# Patient Record
Sex: Female | Born: 1960 | Race: Black or African American | Hispanic: No | Marital: Married
Health system: Southern US, Community
[De-identification: ages and names within clinical notes are randomized; demographics above are authoritative.]

---

## 1997-12-17 ENCOUNTER — Other Ambulatory Visit: Admission: RE | Admit: 1997-12-17 | Discharge: 1997-12-17 | Payer: Self-pay | Admitting: Obstetrics and Gynecology

## 1998-05-03 ENCOUNTER — Encounter: Admission: RE | Admit: 1998-05-03 | Discharge: 1998-08-01 | Payer: Self-pay | Admitting: Obstetrics and Gynecology

## 1998-07-19 ENCOUNTER — Inpatient Hospital Stay (HOSPITAL_COMMUNITY): Admission: AD | Admit: 1998-07-19 | Discharge: 1998-07-21 | Payer: Self-pay | Admitting: Obstetrics and Gynecology

## 1998-09-03 ENCOUNTER — Other Ambulatory Visit: Admission: RE | Admit: 1998-09-03 | Discharge: 1998-09-03 | Payer: Self-pay | Admitting: Obstetrics and Gynecology

## 2000-08-19 ENCOUNTER — Encounter: Payer: Self-pay | Admitting: Family Medicine

## 2000-08-19 ENCOUNTER — Encounter: Admission: RE | Admit: 2000-08-19 | Discharge: 2000-08-19 | Payer: Self-pay | Admitting: Family Medicine

## 2001-09-07 ENCOUNTER — Encounter: Payer: Self-pay | Admitting: Family Medicine

## 2001-09-07 ENCOUNTER — Encounter: Admission: RE | Admit: 2001-09-07 | Discharge: 2001-09-07 | Payer: Self-pay | Admitting: Family Medicine

## 2002-09-11 ENCOUNTER — Encounter: Admission: RE | Admit: 2002-09-11 | Discharge: 2002-09-11 | Payer: Self-pay | Admitting: Family Medicine

## 2002-09-11 ENCOUNTER — Encounter: Payer: Self-pay | Admitting: Family Medicine

## 2003-11-07 ENCOUNTER — Encounter: Admission: RE | Admit: 2003-11-07 | Discharge: 2003-11-07 | Payer: Self-pay | Admitting: Family Medicine

## 2003-11-28 ENCOUNTER — Other Ambulatory Visit: Admission: RE | Admit: 2003-11-28 | Discharge: 2003-11-28 | Payer: Self-pay | Admitting: Family Medicine

## 2004-11-12 ENCOUNTER — Encounter: Admission: RE | Admit: 2004-11-12 | Discharge: 2004-11-12 | Payer: Self-pay | Admitting: Family Medicine

## 2004-11-28 ENCOUNTER — Other Ambulatory Visit: Admission: RE | Admit: 2004-11-28 | Discharge: 2004-11-28 | Payer: Self-pay | Admitting: Family Medicine

## 2004-12-11 ENCOUNTER — Other Ambulatory Visit: Admission: RE | Admit: 2004-12-11 | Discharge: 2004-12-11 | Payer: Self-pay | Admitting: Obstetrics and Gynecology

## 2005-06-10 ENCOUNTER — Other Ambulatory Visit: Admission: RE | Admit: 2005-06-10 | Discharge: 2005-06-10 | Payer: Self-pay | Admitting: Obstetrics and Gynecology

## 2005-11-24 ENCOUNTER — Encounter: Admission: RE | Admit: 2005-11-24 | Discharge: 2005-11-24 | Payer: Self-pay | Admitting: Family Medicine

## 2006-06-24 ENCOUNTER — Other Ambulatory Visit: Admission: RE | Admit: 2006-06-24 | Discharge: 2006-06-24 | Payer: Self-pay | Admitting: Obstetrics and Gynecology

## 2006-09-20 ENCOUNTER — Encounter: Admission: RE | Admit: 2006-09-20 | Discharge: 2006-09-20 | Payer: Self-pay | Admitting: Family Medicine

## 2006-11-29 ENCOUNTER — Encounter: Admission: RE | Admit: 2006-11-29 | Discharge: 2006-11-29 | Payer: Self-pay | Admitting: Family Medicine

## 2007-06-27 ENCOUNTER — Other Ambulatory Visit: Admission: RE | Admit: 2007-06-27 | Discharge: 2007-06-27 | Payer: Self-pay | Admitting: Obstetrics and Gynecology

## 2007-11-30 ENCOUNTER — Encounter: Admission: RE | Admit: 2007-11-30 | Discharge: 2007-11-30 | Payer: Self-pay | Admitting: Family Medicine

## 2008-11-30 ENCOUNTER — Encounter: Admission: RE | Admit: 2008-11-30 | Discharge: 2008-11-30 | Payer: Self-pay | Admitting: Family Medicine

## 2009-06-14 ENCOUNTER — Other Ambulatory Visit: Admission: RE | Admit: 2009-06-14 | Discharge: 2009-06-14 | Payer: Self-pay | Admitting: Family Medicine

## 2009-09-11 ENCOUNTER — Encounter: Admission: RE | Admit: 2009-09-11 | Discharge: 2009-09-11 | Payer: Self-pay | Admitting: Family Medicine

## 2009-09-26 ENCOUNTER — Ambulatory Visit (HOSPITAL_BASED_OUTPATIENT_CLINIC_OR_DEPARTMENT_OTHER): Admission: RE | Admit: 2009-09-26 | Discharge: 2009-09-26 | Payer: Self-pay | Admitting: Orthopedic Surgery

## 2009-12-02 ENCOUNTER — Encounter: Admission: RE | Admit: 2009-12-02 | Discharge: 2009-12-02 | Payer: Self-pay | Admitting: Family Medicine

## 2009-12-16 ENCOUNTER — Encounter: Admission: RE | Admit: 2009-12-16 | Discharge: 2009-12-16 | Payer: Self-pay | Admitting: Family Medicine

## 2009-12-16 IMAGING — US US BREAST R
1 series · 4 of 4 positions shown · non-contrast
Comparison: [DATE], [DATE], [DATE]

CLINICAL DATA: The patient returns for evaluation of a possible
mass in the right breast noted on recent screening study dated
[DATE].

DIGITAL DIAGNOSTIC RIGHT LIMITED MAMMOGRAM  AND RIGHT BREAST
ULTRASOUND:

[Series 1: us breast right · 4 of 4 slices shown]
[im 1/4]
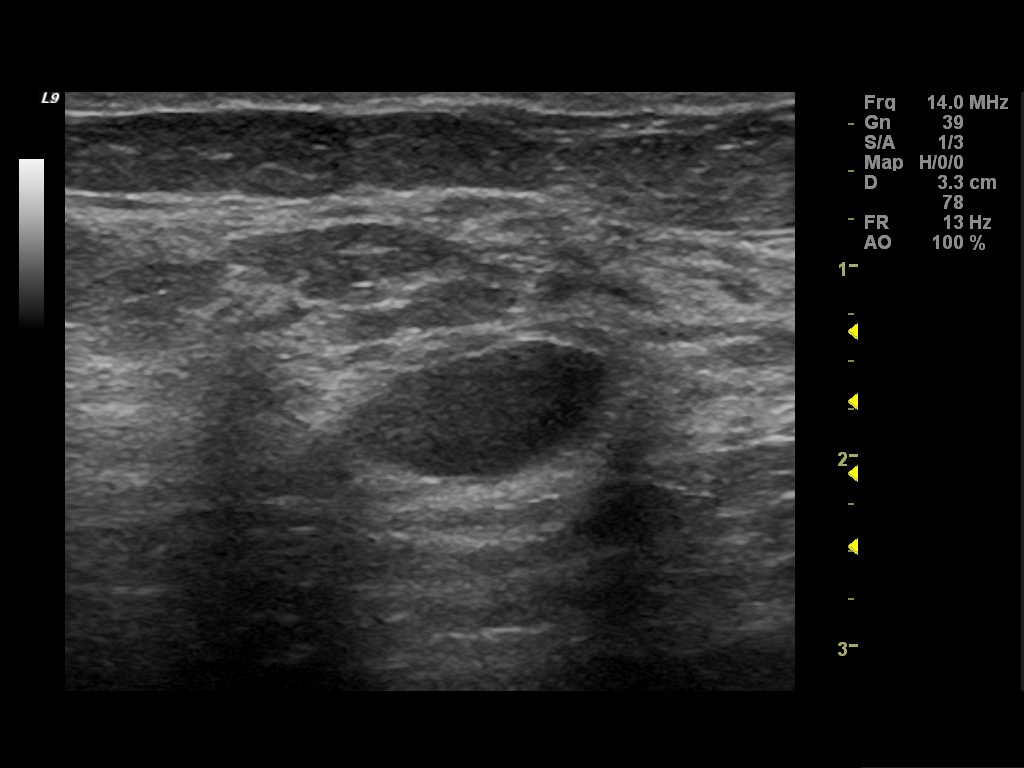
[im 2/4]
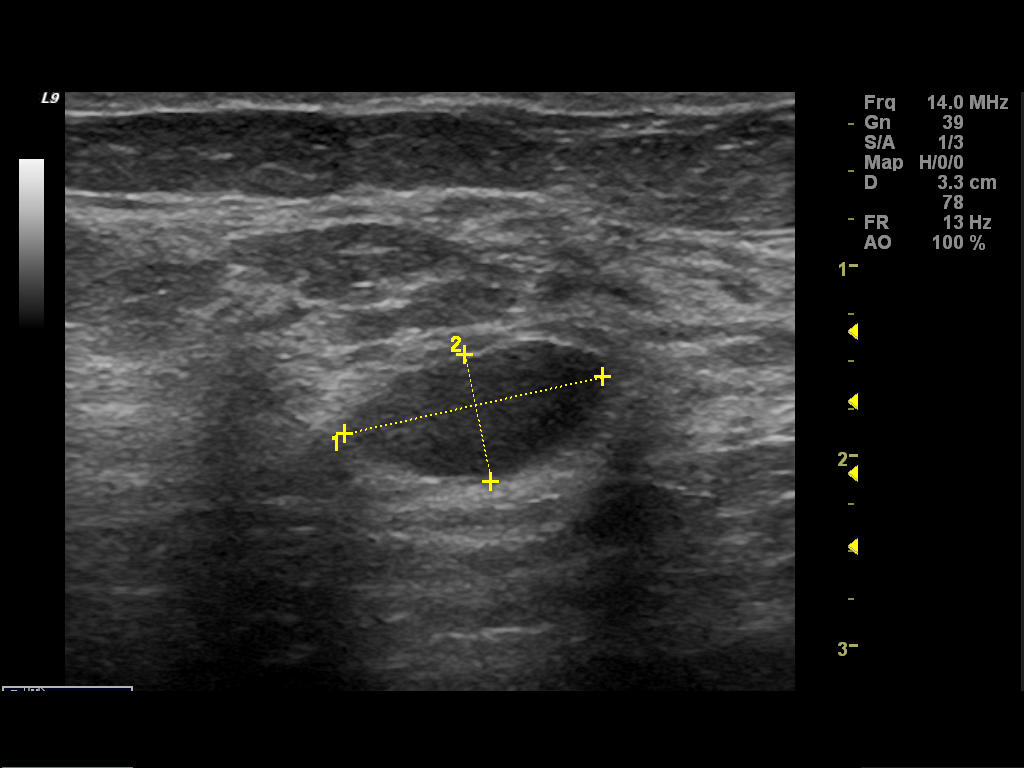
[im 3/4]
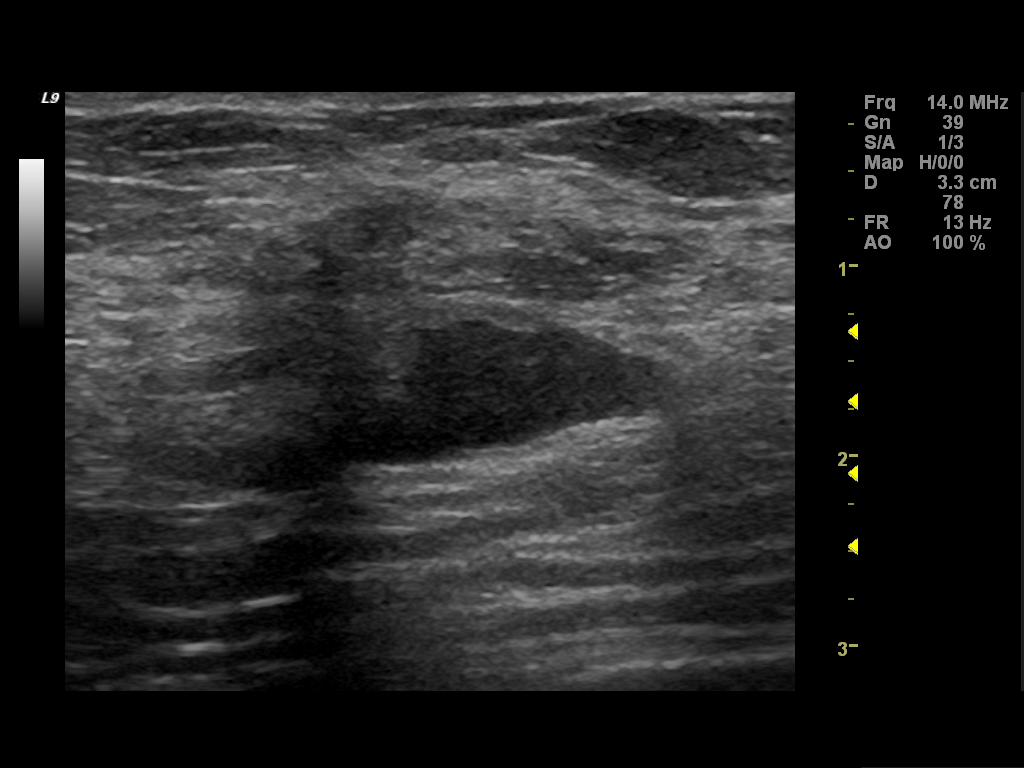
[im 4/4]
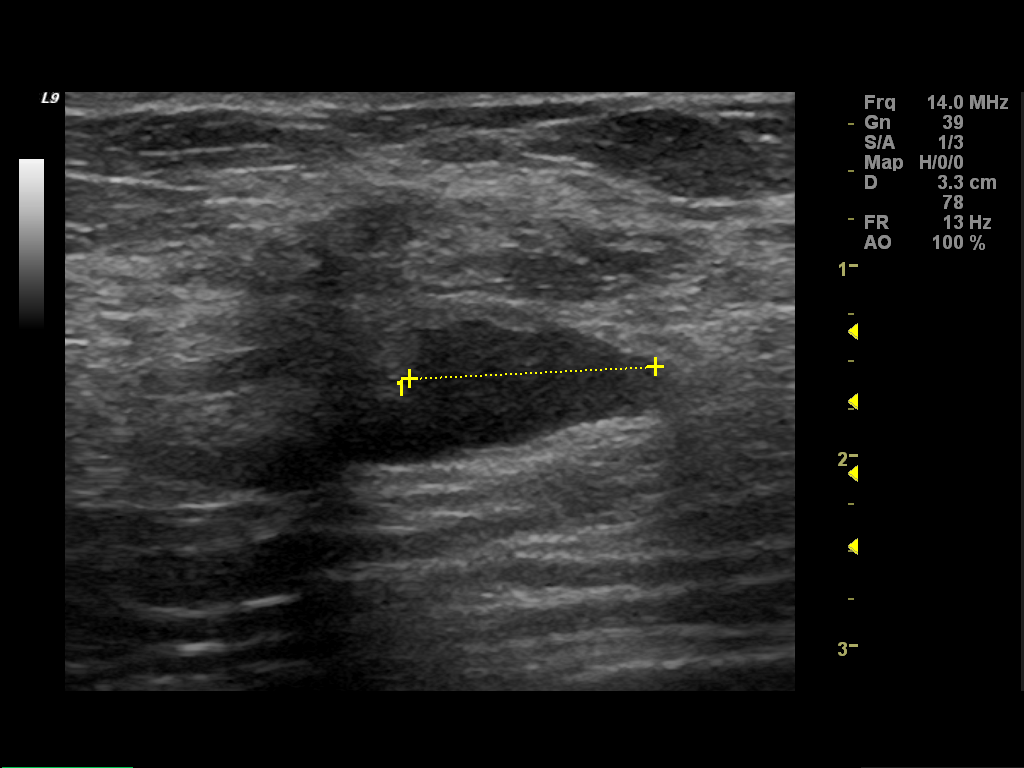

[4 of 4 positions shown; findings below may reference images not displayed]

FINDINGS: Spot compression views confirm the presence of a
partially obscured partially circumscribed oval mass in the mid
right upper outer quadrant.

On physical exam, no mass is palpated in the right upper outer
quadrant.

Ultrasound is performed, showing a cyst at 9 o'clock 7 cm from the
right nipple measuring 1.4 x 0.7 x 1.3 cm.  There is no solid mass,
distortion or shadowing to suggest malignancy.
IMPRESSION: The mammographic nodule corresponds to a cyst.  No mammographic or
sonographic evidence of malignancy.  Yearly screening mammography
is suggested.

BI-RADS CATEGORY 2:  Benign finding(s).

## 2010-07-30 ENCOUNTER — Other Ambulatory Visit (HOSPITAL_COMMUNITY)
Admission: RE | Admit: 2010-07-30 | Discharge: 2010-07-30 | Disposition: A | Discharge status: Home / Self Care | Payer: BC Managed Care – PPO | Source: Ambulatory Visit | Attending: Family Medicine | Admitting: Family Medicine

## 2010-07-30 ENCOUNTER — Other Ambulatory Visit: Payer: Self-pay | Admitting: Family Medicine

## 2010-07-30 DIAGNOSIS — Z01419 Encounter for gynecological examination (general) (routine) without abnormal findings: Secondary | ICD-10-CM | POA: Insufficient documentation

## 2011-01-19 ENCOUNTER — Other Ambulatory Visit: Payer: Self-pay | Admitting: Family Medicine

## 2011-01-19 DIAGNOSIS — Z1231 Encounter for screening mammogram for malignant neoplasm of breast: Secondary | ICD-10-CM

## 2011-02-05 ENCOUNTER — Ambulatory Visit
Admission: RE | Admit: 2011-02-05 | Discharge: 2011-02-05 | Disposition: A | Discharge status: Home / Self Care | Payer: BC Managed Care – PPO | Source: Ambulatory Visit | Attending: Family Medicine | Admitting: Family Medicine

## 2011-02-05 DIAGNOSIS — Z1231 Encounter for screening mammogram for malignant neoplasm of breast: Secondary | ICD-10-CM

## 2011-02-13 ENCOUNTER — Other Ambulatory Visit: Payer: Self-pay | Admitting: Family Medicine

## 2011-02-13 DIAGNOSIS — R928 Other abnormal and inconclusive findings on diagnostic imaging of breast: Secondary | ICD-10-CM

## 2011-02-19 ENCOUNTER — Ambulatory Visit
Admission: RE | Admit: 2011-02-19 | Discharge: 2011-02-19 | Disposition: A | Discharge status: Home / Self Care | Payer: BC Managed Care – PPO | Source: Ambulatory Visit | Attending: Family Medicine | Admitting: Family Medicine

## 2011-02-19 DIAGNOSIS — R928 Other abnormal and inconclusive findings on diagnostic imaging of breast: Secondary | ICD-10-CM

## 2011-09-30 ENCOUNTER — Other Ambulatory Visit (HOSPITAL_COMMUNITY)
Admission: RE | Admit: 2011-09-30 | Discharge: 2011-09-30 | Disposition: A | Discharge status: Home / Self Care | Payer: BC Managed Care – PPO | Source: Ambulatory Visit | Attending: Family Medicine | Admitting: Family Medicine

## 2011-09-30 ENCOUNTER — Other Ambulatory Visit: Payer: Self-pay | Admitting: Family Medicine

## 2011-09-30 DIAGNOSIS — Z124 Encounter for screening for malignant neoplasm of cervix: Secondary | ICD-10-CM | POA: Insufficient documentation

## 2013-05-17 ENCOUNTER — Other Ambulatory Visit: Payer: Self-pay

## 2013-05-17 DIAGNOSIS — Z1231 Encounter for screening mammogram for malignant neoplasm of breast: Secondary | ICD-10-CM

## 2013-05-30 ENCOUNTER — Encounter (INDEPENDENT_AMBULATORY_CARE_PROVIDER_SITE_OTHER): Payer: Self-pay

## 2013-05-30 ENCOUNTER — Ambulatory Visit
Admission: RE | Admit: 2013-05-30 | Discharge: 2013-05-30 | Disposition: A | Discharge status: Home / Self Care | Payer: BC Managed Care – PPO | Source: Ambulatory Visit

## 2013-05-30 DIAGNOSIS — Z1231 Encounter for screening mammogram for malignant neoplasm of breast: Secondary | ICD-10-CM

## 2013-06-01 ENCOUNTER — Other Ambulatory Visit: Payer: Self-pay | Admitting: Family Medicine

## 2013-06-01 DIAGNOSIS — R928 Other abnormal and inconclusive findings on diagnostic imaging of breast: Secondary | ICD-10-CM

## 2013-06-12 ENCOUNTER — Ambulatory Visit
Admission: RE | Admit: 2013-06-12 | Discharge: 2013-06-12 | Disposition: A | Discharge status: Home / Self Care | Payer: BC Managed Care – PPO | Source: Ambulatory Visit | Attending: Family Medicine | Admitting: Family Medicine

## 2013-06-12 DIAGNOSIS — R928 Other abnormal and inconclusive findings on diagnostic imaging of breast: Secondary | ICD-10-CM

## 2013-12-15 ENCOUNTER — Other Ambulatory Visit: Payer: BC Managed Care – PPO

## 2013-12-15 ENCOUNTER — Ambulatory Visit: Payer: BC Managed Care – PPO | Admitting: Hematology and Oncology

## 2014-05-17 ENCOUNTER — Other Ambulatory Visit: Payer: Self-pay

## 2014-05-17 DIAGNOSIS — Z1231 Encounter for screening mammogram for malignant neoplasm of breast: Secondary | ICD-10-CM

## 2014-06-14 ENCOUNTER — Ambulatory Visit
Admission: RE | Admit: 2014-06-14 | Discharge: 2014-06-14 | Disposition: A | Discharge status: Home / Self Care | Payer: BC Managed Care – PPO | Source: Ambulatory Visit

## 2014-06-14 DIAGNOSIS — Z1231 Encounter for screening mammogram for malignant neoplasm of breast: Secondary | ICD-10-CM

## 2014-11-09 ENCOUNTER — Other Ambulatory Visit: Payer: Self-pay | Admitting: Family Medicine

## 2014-11-09 ENCOUNTER — Other Ambulatory Visit (HOSPITAL_COMMUNITY)
Admission: RE | Admit: 2014-11-09 | Discharge: 2014-11-09 | Disposition: A | Discharge status: Home / Self Care | Payer: BC Managed Care – PPO | Source: Ambulatory Visit | Attending: Family Medicine | Admitting: Family Medicine

## 2014-11-09 DIAGNOSIS — Z124 Encounter for screening for malignant neoplasm of cervix: Secondary | ICD-10-CM | POA: Diagnosis not present

## 2014-11-13 LAB — CYTOLOGY - PAP

## 2015-05-16 ENCOUNTER — Other Ambulatory Visit: Payer: Self-pay

## 2015-05-16 DIAGNOSIS — Z1231 Encounter for screening mammogram for malignant neoplasm of breast: Secondary | ICD-10-CM

## 2015-06-19 ENCOUNTER — Ambulatory Visit: Payer: BC Managed Care – PPO

## 2015-07-04 ENCOUNTER — Ambulatory Visit
Admission: RE | Admit: 2015-07-04 | Discharge: 2015-07-04 | Disposition: A | Discharge status: Home / Self Care | Payer: BC Managed Care – PPO | Source: Ambulatory Visit

## 2015-07-04 DIAGNOSIS — Z1231 Encounter for screening mammogram for malignant neoplasm of breast: Secondary | ICD-10-CM

## 2016-05-27 ENCOUNTER — Other Ambulatory Visit: Payer: Self-pay | Admitting: Family Medicine

## 2016-05-27 DIAGNOSIS — Z1231 Encounter for screening mammogram for malignant neoplasm of breast: Secondary | ICD-10-CM

## 2016-07-10 ENCOUNTER — Ambulatory Visit
Admission: RE | Admit: 2016-07-10 | Discharge: 2016-07-10 | Disposition: A | Discharge status: Home / Self Care | Payer: BC Managed Care – PPO | Source: Ambulatory Visit | Attending: Family Medicine | Admitting: Family Medicine

## 2016-07-10 DIAGNOSIS — Z1231 Encounter for screening mammogram for malignant neoplasm of breast: Secondary | ICD-10-CM

## 2017-04-01 ENCOUNTER — Other Ambulatory Visit: Payer: Self-pay | Admitting: Radiology

## 2017-07-12 ENCOUNTER — Other Ambulatory Visit: Payer: Self-pay | Admitting: Family Medicine

## 2017-07-12 DIAGNOSIS — Z1231 Encounter for screening mammogram for malignant neoplasm of breast: Secondary | ICD-10-CM

## 2017-08-02 ENCOUNTER — Ambulatory Visit
Admission: RE | Admit: 2017-08-02 | Discharge: 2017-08-02 | Disposition: A | Discharge status: Home / Self Care | Payer: BC Managed Care – PPO | Source: Ambulatory Visit | Attending: Family Medicine | Admitting: Family Medicine

## 2017-08-02 DIAGNOSIS — Z1231 Encounter for screening mammogram for malignant neoplasm of breast: Secondary | ICD-10-CM

## 2018-01-18 ENCOUNTER — Other Ambulatory Visit: Payer: Self-pay | Admitting: Family Medicine

## 2018-01-18 ENCOUNTER — Other Ambulatory Visit (HOSPITAL_COMMUNITY)
Admission: RE | Admit: 2018-01-18 | Discharge: 2018-01-18 | Disposition: A | Discharge status: Home / Self Care | Payer: BC Managed Care – PPO | Source: Ambulatory Visit | Attending: Family Medicine | Admitting: Family Medicine

## 2018-01-18 DIAGNOSIS — Z01419 Encounter for gynecological examination (general) (routine) without abnormal findings: Secondary | ICD-10-CM | POA: Insufficient documentation

## 2018-01-20 LAB — CYTOLOGY - PAP
Adequacy: ABSENT
Diagnosis: NEGATIVE

## 2018-08-08 ENCOUNTER — Other Ambulatory Visit: Payer: Self-pay | Admitting: Family Medicine

## 2018-08-08 DIAGNOSIS — Z1231 Encounter for screening mammogram for malignant neoplasm of breast: Secondary | ICD-10-CM

## 2018-09-20 ENCOUNTER — Other Ambulatory Visit: Payer: Self-pay

## 2018-09-20 ENCOUNTER — Ambulatory Visit
Admission: RE | Admit: 2018-09-20 | Discharge: 2018-09-20 | Disposition: A | Discharge status: Home / Self Care | Payer: BC Managed Care – PPO | Source: Ambulatory Visit | Attending: Family Medicine | Admitting: Family Medicine

## 2018-09-20 DIAGNOSIS — Z1231 Encounter for screening mammogram for malignant neoplasm of breast: Secondary | ICD-10-CM

## 2019-09-05 ENCOUNTER — Other Ambulatory Visit: Payer: Self-pay | Admitting: Family Medicine

## 2019-09-05 DIAGNOSIS — Z1231 Encounter for screening mammogram for malignant neoplasm of breast: Secondary | ICD-10-CM

## 2019-09-21 ENCOUNTER — Ambulatory Visit
Admission: RE | Admit: 2019-09-21 | Discharge: 2019-09-21 | Disposition: A | Discharge status: Home / Self Care | Payer: BC Managed Care – PPO | Source: Ambulatory Visit | Attending: Family Medicine | Admitting: Family Medicine

## 2019-09-21 ENCOUNTER — Other Ambulatory Visit: Payer: Self-pay

## 2019-09-21 DIAGNOSIS — Z1231 Encounter for screening mammogram for malignant neoplasm of breast: Secondary | ICD-10-CM

## 2020-08-19 ENCOUNTER — Other Ambulatory Visit: Payer: Self-pay | Admitting: Family Medicine

## 2020-08-19 DIAGNOSIS — Z1231 Encounter for screening mammogram for malignant neoplasm of breast: Secondary | ICD-10-CM

## 2020-10-11 ENCOUNTER — Other Ambulatory Visit: Payer: Self-pay

## 2020-10-11 ENCOUNTER — Ambulatory Visit
Admission: RE | Admit: 2020-10-11 | Discharge: 2020-10-11 | Disposition: A | Discharge status: Home / Self Care | Payer: BC Managed Care – PPO | Source: Ambulatory Visit | Attending: Family Medicine | Admitting: Family Medicine

## 2020-10-11 DIAGNOSIS — Z1231 Encounter for screening mammogram for malignant neoplasm of breast: Secondary | ICD-10-CM

## 2021-09-15 ENCOUNTER — Other Ambulatory Visit: Payer: Self-pay | Admitting: Family Medicine

## 2021-09-15 DIAGNOSIS — Z1231 Encounter for screening mammogram for malignant neoplasm of breast: Secondary | ICD-10-CM

## 2021-10-14 ENCOUNTER — Ambulatory Visit
Admission: RE | Admit: 2021-10-14 | Discharge: 2021-10-14 | Disposition: A | Discharge status: Home / Self Care | Payer: BC Managed Care – PPO | Source: Ambulatory Visit | Attending: Family Medicine | Admitting: Family Medicine

## 2021-10-14 DIAGNOSIS — Z1231 Encounter for screening mammogram for malignant neoplasm of breast: Secondary | ICD-10-CM

## 2021-10-15 ENCOUNTER — Other Ambulatory Visit: Payer: Self-pay | Admitting: Family Medicine

## 2021-10-15 DIAGNOSIS — R928 Other abnormal and inconclusive findings on diagnostic imaging of breast: Secondary | ICD-10-CM

## 2021-10-24 ENCOUNTER — Ambulatory Visit
Admission: RE | Admit: 2021-10-24 | Discharge: 2021-10-24 | Disposition: A | Discharge status: Home / Self Care | Payer: BC Managed Care – PPO | Source: Ambulatory Visit | Attending: Family Medicine | Admitting: Family Medicine

## 2021-10-24 DIAGNOSIS — R928 Other abnormal and inconclusive findings on diagnostic imaging of breast: Secondary | ICD-10-CM

## 2022-09-07 ENCOUNTER — Other Ambulatory Visit: Payer: Self-pay | Admitting: Family Medicine

## 2022-09-07 DIAGNOSIS — Z0000 Encounter for general adult medical examination without abnormal findings: Secondary | ICD-10-CM

## 2022-10-16 ENCOUNTER — Ambulatory Visit
Admission: RE | Admit: 2022-10-16 | Discharge: 2022-10-16 | Disposition: A | Discharge status: Home / Self Care | Payer: BC Managed Care – PPO | Source: Ambulatory Visit | Attending: Family Medicine | Admitting: Family Medicine

## 2022-10-16 DIAGNOSIS — Z0000 Encounter for general adult medical examination without abnormal findings: Secondary | ICD-10-CM

## 2023-10-22 ENCOUNTER — Other Ambulatory Visit: Payer: Self-pay | Admitting: Family Medicine

## 2023-10-22 DIAGNOSIS — Z1231 Encounter for screening mammogram for malignant neoplasm of breast: Secondary | ICD-10-CM

## 2023-11-09 ENCOUNTER — Ambulatory Visit: Admission: RE | Admit: 2023-11-09 | Discharge: 2023-11-09 | Disposition: A | Payer: Self-pay | Source: Ambulatory Visit

## 2023-11-09 DIAGNOSIS — Z1231 Encounter for screening mammogram for malignant neoplasm of breast: Secondary | ICD-10-CM
